# Patient Record
Sex: Male | Born: 2000 | Hispanic: No | Marital: Single
Health system: Southern US, Community
[De-identification: ages and names within clinical notes are randomized; demographics above are authoritative.]

## PROBLEM LIST (undated history)

## (undated) DIAGNOSIS — R350 Frequency of micturition: Secondary | ICD-10-CM

## (undated) DIAGNOSIS — N471 Phimosis: Secondary | ICD-10-CM

## (undated) DIAGNOSIS — L729 Follicular cyst of the skin and subcutaneous tissue, unspecified: Secondary | ICD-10-CM

## (undated) DIAGNOSIS — R3915 Urgency of urination: Secondary | ICD-10-CM

## (undated) DIAGNOSIS — Z9229 Personal history of other drug therapy: Secondary | ICD-10-CM

## (undated) HISTORY — PX: NO PAST SURGERIES: SHX2092

---
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
## 2023-06-14 ENCOUNTER — Ambulatory Visit
Admission: RE | Admit: 2023-06-14 | Discharge: 2023-06-14 | Disposition: A | Discharge status: Home / Self Care | Payer: Medicaid Other | Source: Ambulatory Visit | Attending: Pain Medicine | Admitting: Pain Medicine

## 2023-06-14 ENCOUNTER — Other Ambulatory Visit: Payer: Self-pay | Admitting: Pain Medicine

## 2023-06-14 DIAGNOSIS — M25561 Pain in right knee: Secondary | ICD-10-CM

## 2023-12-28 ENCOUNTER — Emergency Department (HOSPITAL_COMMUNITY)
Admission: EM | Admit: 2023-12-28 | Discharge: 2023-12-28 | Disposition: A | Discharge status: Home / Self Care | Payer: No Typology Code available for payment source | Attending: Emergency Medicine | Admitting: Emergency Medicine

## 2023-12-28 ENCOUNTER — Encounter (HOSPITAL_COMMUNITY): Payer: Self-pay

## 2023-12-28 ENCOUNTER — Other Ambulatory Visit: Payer: Self-pay

## 2023-12-28 DIAGNOSIS — Y92410 Unspecified street and highway as the place of occurrence of the external cause: Secondary | ICD-10-CM | POA: Diagnosis not present

## 2023-12-28 DIAGNOSIS — M545 Low back pain, unspecified: Secondary | ICD-10-CM | POA: Diagnosis present

## 2023-12-28 MED ORDER — CYCLOBENZAPRINE HCL 10 MG PO TABS: 10.0000 mg | ORAL_TABLET | Freq: Two times a day (BID) | ORAL | 0 refills | Status: AC | PRN

## 2023-12-28 MED ORDER — IBUPROFEN 600 MG PO TABS: 600.0000 mg | ORAL_TABLET | Freq: Four times a day (QID) | ORAL | 0 refills | Status: AC | PRN
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
## 2024-01-31 ENCOUNTER — Other Ambulatory Visit: Payer: Self-pay

## 2024-01-31 ENCOUNTER — Ambulatory Visit: Attending: Orthopedic Surgery | Admitting: Physical Therapy

## 2024-01-31 ENCOUNTER — Encounter: Payer: Self-pay | Admitting: Physical Therapy

## 2024-01-31 DIAGNOSIS — M542 Cervicalgia: Secondary | ICD-10-CM | POA: Insufficient documentation

## 2024-01-31 DIAGNOSIS — R252 Cramp and spasm: Secondary | ICD-10-CM | POA: Diagnosis present

## 2024-01-31 DIAGNOSIS — M5459 Other low back pain: Secondary | ICD-10-CM | POA: Insufficient documentation
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
## 2024-02-07 ENCOUNTER — Ambulatory Visit: Admitting: Physical Therapy

## 2024-02-07 ENCOUNTER — Encounter: Payer: Self-pay | Admitting: Physical Therapy

## 2024-02-07 DIAGNOSIS — R252 Cramp and spasm: Secondary | ICD-10-CM

## 2024-02-07 DIAGNOSIS — M542 Cervicalgia: Secondary | ICD-10-CM | POA: Diagnosis not present

## 2024-02-07 DIAGNOSIS — M5459 Other low back pain: Secondary | ICD-10-CM

## 2024-02-21 ENCOUNTER — Ambulatory Visit: Attending: Orthopedic Surgery | Admitting: Physical Therapy

## 2024-02-21 DIAGNOSIS — M5459 Other low back pain: Secondary | ICD-10-CM | POA: Insufficient documentation

## 2024-02-21 DIAGNOSIS — M542 Cervicalgia: Secondary | ICD-10-CM | POA: Insufficient documentation

## 2024-02-21 DIAGNOSIS — R252 Cramp and spasm: Secondary | ICD-10-CM | POA: Insufficient documentation

## 2024-11-16 ENCOUNTER — Emergency Department (HOSPITAL_BASED_OUTPATIENT_CLINIC_OR_DEPARTMENT_OTHER)
Admission: EM | Admit: 2024-11-16 | Discharge: 2024-11-16 | Disposition: A | Discharge status: Home / Self Care | Attending: Emergency Medicine | Admitting: Emergency Medicine

## 2024-11-16 ENCOUNTER — Encounter (HOSPITAL_BASED_OUTPATIENT_CLINIC_OR_DEPARTMENT_OTHER): Payer: Self-pay | Admitting: Emergency Medicine

## 2024-11-16 ENCOUNTER — Other Ambulatory Visit: Payer: Self-pay

## 2024-11-16 ENCOUNTER — Other Ambulatory Visit (HOSPITAL_BASED_OUTPATIENT_CLINIC_OR_DEPARTMENT_OTHER): Payer: Self-pay

## 2024-11-16 DIAGNOSIS — E8729 Other acidosis: Secondary | ICD-10-CM | POA: Insufficient documentation

## 2024-11-16 DIAGNOSIS — T730XXA Starvation, initial encounter: Secondary | ICD-10-CM | POA: Diagnosis not present

## 2024-11-16 DIAGNOSIS — R112 Nausea with vomiting, unspecified: Secondary | ICD-10-CM | POA: Diagnosis present

## 2024-11-16 DIAGNOSIS — R739 Hyperglycemia, unspecified: Secondary | ICD-10-CM | POA: Diagnosis not present

## 2024-11-16 LAB — LIPASE, BLOOD: Lipase: 24 U/L (ref 11–51)

## 2024-11-16 LAB — URINALYSIS, MICROSCOPIC (REFLEX)
RBC / HPF: NONE SEEN RBC/hpf (ref 0–5)
Squamous Epithelial / HPF: NONE SEEN /HPF (ref 0–5)
WBC, UA: NONE SEEN WBC/hpf (ref 0–5)

## 2024-11-16 LAB — URINALYSIS, ROUTINE W REFLEX MICROSCOPIC
Glucose, UA: NEGATIVE mg/dL
Ketones, ur: >=80 mg/dL — AB
Leukocytes,Ua: NEGATIVE
Nitrite: NEGATIVE
Protein, ur: 30 mg/dL — AB
Specific Gravity, Urine: >=1.03 (ref 1.005–1.030)
pH: 5.5 (ref 5.0–8.0)

## 2024-11-16 LAB — COMPREHENSIVE METABOLIC PANEL WITH GFR
ALT: 16 U/L (ref 0–44)
AST: 27 U/L (ref 15–41)
Albumin: 5.2 g/dL — ABNORMAL HIGH (ref 3.5–5.0)
Alkaline Phosphatase: 63 U/L (ref 38–126)
Anion gap: 24 — ABNORMAL HIGH (ref 5–15)
BUN: 16 mg/dL (ref 6–20)
CO2: 19 mmol/L — ABNORMAL LOW (ref 22–32)
Calcium: 10.3 mg/dL (ref 8.9–10.3)
Chloride: 91 mmol/L — ABNORMAL LOW (ref 98–111)
Creatinine, Ser: 0.85 mg/dL (ref 0.61–1.24)
GFR, Estimated: >60 mL/min
Glucose, Bld: 90 mg/dL (ref 70–99)
Potassium: 4.1 mmol/L (ref 3.5–5.1)
Sodium: 134 mmol/L — ABNORMAL LOW (ref 135–145)
Total Bilirubin: 1.4 mg/dL — ABNORMAL HIGH (ref 0.0–1.2)
Total Protein: 8.5 g/dL — ABNORMAL HIGH (ref 6.5–8.1)

## 2024-11-16 LAB — CBC WITH DIFFERENTIAL/PLATELET
Abs Immature Granulocytes: 0.02 K/uL (ref 0.00–0.07)
Basophils Absolute: 0 K/uL (ref 0.0–0.1)
Basophils Relative: 0 %
Eosinophils Absolute: 0 K/uL (ref 0.0–0.5)
Eosinophils Relative: 0 %
HCT: 42.8 % (ref 39.0–52.0)
Hemoglobin: 15.5 g/dL (ref 13.0–17.0)
Immature Granulocytes: 0 %
Lymphocytes Relative: 8 %
Lymphs Abs: 0.6 K/uL — ABNORMAL LOW (ref 0.7–4.0)
MCH: 29 pg (ref 26.0–34.0)
MCHC: 36.2 g/dL — ABNORMAL HIGH (ref 30.0–36.0)
MCV: 80.1 fL (ref 80.0–100.0)
Monocytes Absolute: 0.2 K/uL (ref 0.1–1.0)
Monocytes Relative: 3 %
Neutro Abs: 6.2 K/uL (ref 1.7–7.7)
Neutrophils Relative %: 89 %
Platelets: 379 K/uL (ref 150–400)
RBC: 5.34 MIL/uL (ref 4.22–5.81)
RDW: 11.1 % — ABNORMAL LOW (ref 11.5–15.5)
WBC: 7 K/uL (ref 4.0–10.5)
nRBC: 0 % (ref 0.0–0.2)

## 2024-11-16 LAB — BASIC METABOLIC PANEL WITH GFR
Anion gap: 15 (ref 5–15)
BUN: 13 mg/dL (ref 6–20)
CO2: 19 mmol/L — ABNORMAL LOW (ref 22–32)
Calcium: 8.4 mg/dL — ABNORMAL LOW (ref 8.9–10.3)
Chloride: 100 mmol/L (ref 98–111)
Creatinine, Ser: 0.75 mg/dL (ref 0.61–1.24)
GFR, Estimated: >60 mL/min
Glucose, Bld: 117 mg/dL — ABNORMAL HIGH (ref 70–99)
Potassium: 3.9 mmol/L (ref 3.5–5.1)
Sodium: 134 mmol/L — ABNORMAL LOW (ref 135–145)

## 2024-11-16 LAB — CK: Total CK: 93 U/L (ref 49–397)

## 2024-11-16 LAB — RESP PANEL BY RT-PCR (RSV, FLU A&B, COVID)  RVPGX2
Influenza A by PCR: NEGATIVE
Influenza B by PCR: NEGATIVE
Resp Syncytial Virus by PCR: NEGATIVE
SARS Coronavirus 2 by RT PCR: NEGATIVE

## 2024-11-16 MED ORDER — DIAZEPAM 5 MG/ML IJ SOLN
2.5000 mg | Freq: Once | INTRAMUSCULAR | Status: AC
  Administered 2024-11-16: 2.5 mg via INTRAVENOUS
  Filled 2024-11-16: qty 2

## 2024-11-16 MED ORDER — SODIUM CHLORIDE 0.9 % IV BOLUS
1000.0000 mL | Freq: Once | INTRAVENOUS | Status: AC
  Administered 2024-11-16: 1000 mL via INTRAVENOUS

## 2024-11-16 MED ORDER — ONDANSETRON 4 MG PO TBDP
4.0000 mg | ORAL_TABLET | ORAL | 0 refills | Status: AC | PRN
  Filled 2024-11-16: qty 10, 2d supply, fill #0

## 2024-11-16 MED ORDER — HYOSCYAMINE SULFATE 0.125 MG SL SUBL
0.1250 mg | SUBLINGUAL_TABLET | Freq: Once | SUBLINGUAL | Status: AC
  Administered 2024-11-16: 0.125 mg via SUBLINGUAL
  Filled 2024-11-16: qty 1

## 2024-11-16 MED ORDER — ONDANSETRON 4 MG PO TBDP
4.0000 mg | ORAL_TABLET | Freq: Once | ORAL | Status: AC
  Administered 2024-11-16: 4 mg via ORAL
  Filled 2024-11-16: qty 1

## 2024-11-16 MED ORDER — FAMOTIDINE IN NACL 20-0.9 MG/50ML-% IV SOLN
20.0000 mg | Freq: Once | INTRAVENOUS | Status: AC
  Administered 2024-11-16: 20 mg via INTRAVENOUS
  Filled 2024-11-16: qty 50

## 2024-11-16 MED ORDER — DICYCLOMINE HCL 20 MG PO TABS
20.0000 mg | ORAL_TABLET | Freq: Two times a day (BID) | ORAL | 0 refills | Status: AC
  Filled 2024-11-16: qty 20, 10d supply, fill #0

## 2024-11-16 MED ORDER — PROMETHAZINE HCL 25 MG PO TABS
25.0000 mg | ORAL_TABLET | Freq: Every evening | ORAL | 0 refills | Status: AC | PRN
  Filled 2024-11-16: qty 6, 6d supply, fill #0

## 2024-11-16 MED ORDER — DEXTROSE 5 % IV BOLUS
500.0000 mL | Freq: Once | INTRAVENOUS | Status: AC
  Administered 2024-11-16: 500 mL via INTRAVENOUS

## 2024-11-16 NOTE — ED Triage Notes (Signed)
 Pt reports he has had n/v/d x 3d, unable to keep anything down

## 2024-11-16 NOTE — ED Notes (Signed)
Pt given cup for UA.

## 2024-11-16 NOTE — ED Notes (Signed)
PO challenge tolerated well.

## 2024-11-16 NOTE — ED Notes (Signed)
 Sun[employer] printer not working, pt label used instead

## 2024-11-16 NOTE — ED Provider Notes (Signed)
 " [place] [facility] Provider Note   CSN: [id number] Arrival date & time: 11/16/24  1035     Patient presents with: Emesis   Justin Medina is a 23 y.o. male who presents emergency department chief complaint of vomiting.  Patient reports he had onset of vomiting has had hot and cold sweats.  Symptoms began 3 days ago.  Patient has also had some diarrhea.  He reports that vomiting is his worst symptoms.  He has associated stomach cramp body aches and fatigue.  He had intractable repetitive vomiting all night long inability to sleep.  Patient states I have never been the second my life and this is the forced have ever felt.  He has some bodyaches.  He denies history of marijuana use, he states he retirees felt similar symptoms as in college when he had a hangover.  He denies any other significant symptoms.    Emesis      Prior to Admission medications  Medication Sig Start Date End Date Taking? Authorizing Provider  cyclobenzaprine  (FLEXERIL ) 10 MG tablet Take 1 tablet (10 mg total) by mouth 2 (two) times daily as needed for muscle spasms. 12/28/23   Nivia Colon, PA-C  ibuprofen  (ADVIL ) 600 MG tablet Take 1 tablet (600 mg total) by mouth every 6 (six) hours as needed. 12/28/23   Nivia Colon, PA-C  senna-docusate (SENOKOT-S) 8.6-50 MG tablet Take 1 tablet by mouth 2 (two) times daily. While taking strong pain meds to prevent constipation 07/27/19   Manny, Ricardo KATHEE Raddle., MD    Allergies: Patient has no known allergies.    Review of Systems  Gastrointestinal:  Positive for vomiting.    Updated Vital Signs BP (!) 116/97 (BP Location: Right Arm)   Pulse 77   Temp 98 F (36.7 C) (Oral)   Resp 17   Ht 5' 8 (1.727 m)   Wt 68 kg   SpO2 100%   BMI 22.81 kg/m   Physical Exam Vitals and nursing note reviewed.  Constitutional:      General: He is not in acute distress.    Appearance: He is well-developed. He is not diaphoretic.  HENT:     Head:  Normocephalic and atraumatic.  Eyes:     General: No scleral icterus.    Conjunctiva/sclera: Conjunctivae normal.  Cardiovascular:     Rate and Rhythm: Normal rate and regular rhythm.     Heart sounds: Normal heart sounds.  Pulmonary:     Effort: Pulmonary effort is normal. No respiratory distress.     Breath sounds: Normal breath sounds.  Abdominal:     Palpations: Abdomen is soft.     Tenderness: There is no abdominal tenderness.  Musculoskeletal:     Cervical back: Normal range of motion and neck supple.  Skin:    General: Skin is warm and dry.  Neurological:     Mental Status: He is alert.  Psychiatric:        Behavior: Behavior normal.     (all labs ordered are listed, but only abnormal results are displayed) Labs Reviewed  CBC WITH DIFFERENTIAL/PLATELET - Abnormal; Notable for the following components:      Result Value   MCHC 36.2 (*)    RDW 11.1 (*)    Lymphs Abs 0.6 (*)    All other components within normal limits  RESP PANEL BY RT-PCR (RSV, FLU A&B, COVID)  [id number]  COMPREHENSIVE METABOLIC PANEL WITH GFR  LIPASE, BLOOD  URINALYSIS, ROUTINE W  REFLEX MICROSCOPIC    EKG: None  Radiology: No results found.   Procedures   Medications Ordered in the ED  ondansetron  (ZOFRAN -ODT) disintegrating tablet 4 mg (4 mg Oral Given 11/16/24 1137)    Clinical Course as of 11/16/24 1318  Fri Nov 16, 2024  1259 Sodium(!): 134 [AH]  1259 CO2(!): 19 [AH]  1259 Anion gap(!): 24 [AH]  1259 Ketones, ur(!): >=80 [AH]  1315 Comprehensive metabolic panel(!) [AH]  1315 Anion gap(!): 24 [AH]  1315 CO2(!): 19 [AH]  1315 Ketones, ur(!): >=80 Labs consistent with starvation ketosis [AH]    Clinical Course User Index [AH] Arloa Chroman, PA-C                                 Medical Decision Making 23 year old male who presents emergency department with chief complaint of n/v/d The emergent differential diagnosis for vomiting includes, but is not limited to ACS/MI, DKA,  Ischemic bowel, Meningitis, Sepsis, Acute gastric dilation, Adrenal insufficiency, Appendicitis,  Bowel obstruction/ileus, Carbon monoxide poisoning, Cholecystitis, Electrolyte abnormalities, Elevated ICP, Gastric outlet obstruction, Pancreatitis, Ruptured viscus, Biliary colic, Cannabinoid hyperemesis syndrome, Gastritis, Gastroenteritis, Gastroparesis,  Narcotic withdrawal, Peptic ulcer disease, and UTI  Patient has no significant comorbidities. I ordered and interpreted labs.  Initial CMP as per ED course shows low bicarb level, mildly elevated bilirubin anion gap of 24 with greater than 80 ketones in the patient's urine.   BC without abnormality.  Patient has a benign abdominal exam I considered CT imaging however his abdomen is nontender.  Patient's labs do show evidence of starvation ketosis in the setting of nausea vomiting and diarrhea.  He was given 2 L of fluid, IV D5, patient's nausea was treated in the emergency department with Zofran .  He got Valium  for increased nausea control and patient felt significantly improved. Patient was also given food and fluids which she tolerated well. I repeated a BMP patient's anion gap had closed.  His glucose was slightly elevated after IV D5 and oral food and sodas.  Patient is feeling greatly improved.  He has had no active vomiting or diarrhea here in the emergency department.  Will discharge with Phenergan  which he may use at night for sleep and nausea control, I will give him Zofran  for daytime use and Bentyl  for cramps.  He may use over-the-counter Imodium as needed.  Appropriate for discharge strict return precautions.  Amount and/or Complexity of Data Reviewed Labs: ordered. Decision-making details documented in ED Course.  Risk Prescription drug management.        Final diagnoses:  None    ED Discharge Orders     None          Arloa Chroman, PA-C 11/16/24 1944    Ruthe Cornet, DO 11/18/24 1918  "

## 2024-11-16 NOTE — Discharge Instructions (Signed)
Contact a health care provider if you: Cannot keep fluids down. Have symptoms that get worse. Have new symptoms. Feel light-headed or dizzy. Have muscle cramps. Get help right away if you: Have chest pain. Have trouble breathing or you are breathing very quickly. Have a fast heartbeat. Feel extremely weak or you faint. Have a severe headache, a stiff neck, or both. Have a rash. Have severe pain, cramping, or bloating in your abdomen. Have skin that feels cold and clammy. Feel confused. Have pain when you urinate. Have signs of dehydration, such as: Dark urine, very little urine, or no urine. Cracked lips. Dry mouth. Sunken eyes. Sleepiness. Weakness. Have signs of bleeding, such as: Seeing blood in your vomit. Having vomit that looks like coffee grounds. Having bloody or black stools or stools that look like tar. These symptoms may be an emergency. Get help right away. Call 911. Do not wait to see if the symptoms will go away. Do not drive yourself to the hospital. 

## 2024-11-18 ENCOUNTER — Emergency Department (HOSPITAL_BASED_OUTPATIENT_CLINIC_OR_DEPARTMENT_OTHER)
Admission: EM | Admit: 2024-11-18 | Discharge: 2024-11-18 | Disposition: A | Discharge status: Home / Self Care | Attending: Emergency Medicine | Admitting: Emergency Medicine

## 2024-11-18 ENCOUNTER — Encounter (HOSPITAL_BASED_OUTPATIENT_CLINIC_OR_DEPARTMENT_OTHER): Payer: Self-pay | Admitting: Emergency Medicine

## 2024-11-18 DIAGNOSIS — M791 Myalgia, unspecified site: Secondary | ICD-10-CM | POA: Insufficient documentation

## 2024-11-18 DIAGNOSIS — R531 Weakness: Secondary | ICD-10-CM | POA: Insufficient documentation

## 2024-11-18 DIAGNOSIS — R638 Other symptoms and signs concerning food and fluid intake: Secondary | ICD-10-CM | POA: Diagnosis not present

## 2024-11-18 LAB — CBC WITH DIFFERENTIAL/PLATELET
Abs Immature Granulocytes: 0.02 K/uL (ref 0.00–0.07)
Basophils Absolute: 0 K/uL (ref 0.0–0.1)
Basophils Relative: 1 %
Eosinophils Absolute: 0.1 K/uL (ref 0.0–0.5)
Eosinophils Relative: 1 %
HCT: 42.1 % (ref 39.0–52.0)
Hemoglobin: 15.4 g/dL (ref 13.0–17.0)
Immature Granulocytes: 0 %
Lymphocytes Relative: 24 %
Lymphs Abs: 1.4 K/uL (ref 0.7–4.0)
MCH: 29.6 pg (ref 26.0–34.0)
MCHC: 36.6 g/dL — ABNORMAL HIGH (ref 30.0–36.0)
MCV: 81 fL (ref 80.0–100.0)
Monocytes Absolute: 0.5 K/uL (ref 0.1–1.0)
Monocytes Relative: 9 %
Neutro Abs: 3.6 K/uL (ref 1.7–7.7)
Neutrophils Relative %: 65 %
Platelets: 344 K/uL (ref 150–400)
RBC: 5.2 MIL/uL (ref 4.22–5.81)
RDW: 11 % — ABNORMAL LOW (ref 11.5–15.5)
WBC: 5.6 K/uL (ref 4.0–10.5)
nRBC: 0 % (ref 0.0–0.2)

## 2024-11-18 LAB — COMPREHENSIVE METABOLIC PANEL WITH GFR
ALT: 11 U/L (ref 0–44)
AST: 22 U/L (ref 15–41)
Albumin: 4.8 g/dL (ref 3.5–5.0)
Alkaline Phosphatase: 60 U/L (ref 38–126)
Anion gap: 13 (ref 5–15)
BUN: 9 mg/dL (ref 6–20)
CO2: 24 mmol/L (ref 22–32)
Calcium: 9.9 mg/dL (ref 8.9–10.3)
Chloride: 100 mmol/L (ref 98–111)
Creatinine, Ser: 0.76 mg/dL (ref 0.61–1.24)
GFR, Estimated: >60 mL/min
Glucose, Bld: 94 mg/dL (ref 70–99)
Potassium: 3.8 mmol/L (ref 3.5–5.1)
Sodium: 137 mmol/L (ref 135–145)
Total Bilirubin: 0.9 mg/dL (ref 0.0–1.2)
Total Protein: 7.7 g/dL (ref 6.5–8.1)

## 2024-11-18 LAB — URINALYSIS, ROUTINE W REFLEX MICROSCOPIC
Bilirubin Urine: NEGATIVE
Glucose, UA: NEGATIVE mg/dL
Hgb urine dipstick: NEGATIVE
Ketones, ur: NEGATIVE mg/dL
Leukocytes,Ua: NEGATIVE
Nitrite: NEGATIVE
Protein, ur: NEGATIVE mg/dL
Specific Gravity, Urine: 1.015 (ref 1.005–1.030)
pH: 7.5 (ref 5.0–8.0)

## 2024-11-18 LAB — CK: Total CK: 98 U/L (ref 49–397)

## 2024-11-18 MED ORDER — LACTATED RINGERS IV BOLUS
1000.0000 mL | Freq: Once | INTRAVENOUS | Status: AC
  Administered 2024-11-18: 1000 mL via INTRAVENOUS

## 2024-11-18 MED ORDER — KETOROLAC TROMETHAMINE 15 MG/ML IJ SOLN
15.0000 mg | Freq: Once | INTRAMUSCULAR | Status: AC
  Administered 2024-11-18: 15 mg via INTRAVENOUS
  Filled 2024-11-18: qty 1

## 2024-11-18 NOTE — ED Notes (Signed)
Called lab for CK add-on

## 2024-11-18 NOTE — ED Provider Notes (Signed)
 " [facility] Provider Note   CSN: [id number] Arrival date & time: 11/18/24  1420     Patient presents with: Weakness   Justin Medina is a 23 y.o. male.   23 year old male presents for generalized weakness, muscle aches.  He was seen 2 days ago for significant nausea and vomiting.  He states that has improved.  Still having cold sweats and chills.  Decreased appetite.  He is tolerating p.o. fluids.  The history is provided by the patient and a parent. No language interpreter was used.       Prior to Admission medications  Medication Sig Start Date End Date Taking? Authorizing Provider  cyclobenzaprine  (FLEXERIL ) 10 MG tablet Take 1 tablet (10 mg total) by mouth 2 (two) times daily as needed for muscle spasms. 12/28/23   Nivia Colon, PA-C  dicyclomine  (BENTYL ) 20 MG tablet Take 1 tablet (20 mg total) by mouth 2 (two) times daily. 11/16/24   Harris, Abigail, PA-C  ibuprofen  (ADVIL ) 600 MG tablet Take 1 tablet (600 mg total) by mouth every 6 (six) hours as needed. 12/28/23   Nivia Colon, PA-C  ondansetron  (ZOFRAN -ODT) 4 MG disintegrating tablet Take 1 tablet (4 mg total) by mouth every 4 (four) hours as needed for nausea/vomiting. 11/16/24   Harris, Abigail, PA-C  promethazine  (PHENERGAN ) 25 MG tablet Take 1 tablet (25 mg total) by mouth at bedtime as needed for nausea or vomiting. 11/16/24   Harris, Abigail, PA-C  senna-docusate (SENOKOT-S) 8.6-50 MG tablet Take 1 tablet by mouth 2 (two) times daily. While taking strong pain meds to prevent constipation 07/27/19   Manny, Ricardo KATHEE Raddle., MD    Allergies: Patient has no known allergies.    Review of Systems  Updated Vital Signs BP 126/79 (BP Location: Right Arm)   Pulse 71   Temp 97.9 F (36.6 C)   Resp 18   Ht 5' 8 (1.727 m)   Wt 68 kg   SpO2 100%   BMI 22.81 kg/m   Physical Exam Vitals and nursing note reviewed.  Constitutional:      General: He is not in acute distress.     Appearance: Normal appearance. He is not ill-appearing.  HENT:     Head: Normocephalic and atraumatic.     Nose: Nose normal.  Eyes:     Extraocular Movements: Extraocular movements intact.     Conjunctiva/sclera: Conjunctivae normal.  Cardiovascular:     Rate and Rhythm: Normal rate and regular rhythm.  Pulmonary:     Effort: Pulmonary effort is normal. No respiratory distress.  Abdominal:     General: There is no distension.     Palpations: Abdomen is soft.     Tenderness: There is no abdominal tenderness. There is no guarding.  Musculoskeletal:        General: No deformity. Normal range of motion.     Cervical back: Normal range of motion.  Skin:    Findings: No rash.  Neurological:     General: No focal deficit present.     Mental Status: He is alert and oriented to person, place, and time.     Cranial Nerves: No cranial nerve deficit.     Motor: No weakness.     Comments: Good range of motion of the cervical spine.  Cranial nerves III through XII intact.  EOMs grossly intact.     (all labs ordered are listed, but only abnormal results are displayed) Labs Reviewed  CBC WITH DIFFERENTIAL/PLATELET -  Abnormal; Notable for the following components:      Result Value   MCHC 36.6 (*)    RDW 11.0 (*)    All other components within normal limits  URINALYSIS, ROUTINE W REFLEX MICROSCOPIC - Abnormal; Notable for the following components:   Color, Urine STRAW (*)    All other components within normal limits  COMPREHENSIVE METABOLIC PANEL WITH GFR  CK    EKG: None  Radiology: No results found.   Procedures   Medications Ordered in the ED  lactated ringers  bolus 1,000 mL (has no administration in time range)  ketorolac  (TORADOL ) 15 MG/ML injection 15 mg (has no administration in time range)                                    Medical Decision Making Amount and/or Complexity of Data Reviewed Labs: ordered.  Risk Prescription drug management.   Medical Decision  Making / ED Course   This patient presents to the ED for concern of weakness, this involves an extensive number of treatment options, and is a complaint that carries with it a high risk of complications and morbidity.  The differential diagnosis includes dehydration, viral URI, rhabdo  MDM: 23 year old male presents today for concern of generalized muscle aches, weakness, cold sweats.  He was seen a couple days ago.  His labs at the time were consistent with starvation ketosis. No chest pain or shortness of breath. UA without ketones today.  CBC without leukocytosis or anemia.  CMP unremarkable.  Will add on a CK, give fluids and give a dose of Toradol  and reevaluate.  1800: On reevaluation patient has significantly improved.  CK within normal.  Patient is stable for discharge.  Discussed taking Tylenol  and ibuprofen  for the body aches and chills. Discussed close follow-up with PCP.  Discharged in stable condition.  Return precaution discussed.   Additional history obtained: -Additional history obtained from father at bedside, recent ED visit -External records from outside source obtained and reviewed including: Chart review including previous notes, labs, imaging, consultation notes   Lab Tests: -I ordered, reviewed, and interpreted labs.   The pertinent results include:   Labs Reviewed  CBC WITH DIFFERENTIAL/PLATELET - Abnormal; Notable for the following components:      Result Value   MCHC 36.6 (*)    RDW 11.0 (*)    All other components within normal limits  URINALYSIS, ROUTINE W REFLEX MICROSCOPIC - Abnormal; Notable for the following components:   Color, Urine STRAW (*)    All other components within normal limits  COMPREHENSIVE METABOLIC PANEL WITH GFR  CK      EKG  EKG Interpretation Date/Time:    Ventricular Rate:    PR Interval:    QRS Duration:    QT Interval:    QTC Calculation:   R Axis:      Text Interpretation:          Medicines ordered and  prescription drug management: Meds ordered this encounter  Medications   lactated ringers  bolus 1,000 mL   ketorolac  (TORADOL ) 15 MG/ML injection 15 mg    -I have reviewed the patients home medicines and have made adjustments as needed  Reevaluation: After the interventions noted above, I reevaluated the patient and found that they have :improved  Co morbidities that complicate the patient evaluation  Past Medical History:  Diagnosis Date   Frequency of urination  Immunizations up to date    Phimosis    Scrotal cyst    Urgency of urination       Dispostion: Discharged in stable condition.  Return precaution discussed.  Patient voices understanding and is in agreement with plan.  Final diagnoses:  Generalized weakness    ED Discharge Orders     None          Hildegard Loge, PA-C 11/18/24 1810    Patt Alm Macho, MD 11/18/24 2342  "

## 2024-11-18 NOTE — ED Triage Notes (Signed)
 Pt c/o general weakness and feel like I'm going to pass out; was seen 2 days ago for NV; reports he is  tolerating fluids; A & O, appropriate in triage

## 2024-11-18 NOTE — ED Notes (Signed)
 Patient was discharged by another provider. Unsure of process.

## 2024-11-18 NOTE — Discharge Instructions (Signed)
 Your blood work today looked reassuring.  You felt somewhat improved after the fluids and Toradol  which was an anti-inflammatory medication.  I recommend that you take Tylenol  and ibuprofen  in addition to the Zofran  you are prescribed.  You can take 1000 mg of Tylenol  every 6 hours, 600 mg of ibuprofen  every 6 hours.  Return for any concerning symptoms.  Follow-up with your primary care doctor for reevaluation in the next 2 days.
